# Patient Record
Sex: Male | Born: 2000 | Race: White | Hispanic: No | Marital: Single | Smoking: Never smoker
Health system: Southern US, Community
[De-identification: ages and names within clinical notes are randomized; demographics above are authoritative.]

## PROBLEM LIST (undated history)

## (undated) DIAGNOSIS — Q549 Hypospadias, unspecified: Secondary | ICD-10-CM

## (undated) DIAGNOSIS — T7840XA Allergy, unspecified, initial encounter: Secondary | ICD-10-CM

## (undated) DIAGNOSIS — M79673 Pain in unspecified foot: Secondary | ICD-10-CM

## (undated) HISTORY — PX: WISDOM TOOTH EXTRACTION: SHX21

## (undated) HISTORY — DX: Allergy, unspecified, initial encounter: T78.40XA

## (undated) HISTORY — DX: Hypospadias, unspecified: Q54.9

## (undated) HISTORY — DX: Pain in unspecified foot: M79.673

## (undated) HISTORY — PX: HYPOSPADIAS CORRECTION: SHX483

---
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
## 2019-09-29 NOTE — Progress Notes (Deleted)
Phone: [phone]    Subjective:  Patient presents today for their annual physical. Chief complaint-noted.   See problem oriented charting- ROS- full  review of systems was completed and negative  except for: ***  The following were reviewed and entered/updated in epic: Past Medical History:  Diagnosis Date  . Allergy   . Foot arch pain    wears inserts. [facility]  . Hypospadias    surgery as child   Patient Active Problem List   Diagnosis Date Noted  . Allergic rhinitis 03/09/2016  . Hypospadias    Past Surgical History:  Procedure Laterality Date  . HYPOSPADIAS CORRECTION     2007. had some glandular adhesions also treated after surgery.   . WISDOM TOOTH EXTRACTION      Family History  Problem Relation Age of Onset  . Depression Mother        divorced and remarried. homemaker  . Hyperlipidemia Father        under dads insurance  . Bradycardia Father   . Healthy Sister        5 years younger  . Hypertension Maternal Grandmother   . Depression Maternal Grandmother   . Arthritis Maternal Grandmother   . Hypertension Maternal Grandfather   . Arthritis Maternal Grandfather        prediabetic as well    Medications- reviewed and updated Current Outpatient Medications  Medication Sig Dispense Refill  . ampicillin (PRINCIPEN) 500 MG capsule Take 500 mg by mouth 2 (two) times daily.     . Multiple Vitamin (MULTIVITAMIN) tablet Take 1 tablet by mouth daily.    Marland Kitchen tretinoin (RETIN-A) 0.025 % cream Apply 1 application topically at bedtime.      No current facility-administered medications for this visit.    Allergies-reviewed and updated No Known Allergies  Social History   Social History Narrative   Single. Splits time between mom (remarried) and dads house. 1 sister. Sister at [employer] freshman fall 2019. Wants to study bsuiness   [employer] graduate. IB program. [employer]      Enjoys: Soccer and basketball      Dogs:  Maltese and yorkie.        Objective:  There were no vitals taken for this visit. Gen: NAD, resting comfortably HEENT: Mucous membranes are moist. Oropharynx normal Neck: no thyromegaly CV: RRR no murmurs rubs or gallops Lungs: CTAB no crackles, wheeze, rhonchi Abdomen: soft/nontender/nondistended/normal bowel sounds. No rebound or guarding.  Ext: no edema Skin: warm, dry Neuro: grossly normal, moves all extremities, PERRLA ***    Assessment and Plan:  19 y.o. male presenting for annual physical.  Health Maintenance counseling: 1. Anticipatory guidance: Patient counseled regarding regular dental exams ***q6 months, eye exams ***,  avoiding smoking and second hand smoke*** , limiting alcohol to 2 beverages per day***.   2. Risk factor reduction:  Advised patient of need for regular exercise and diet rich and fruits and vegetables to reduce risk of heart attack and stroke. Exercise- ***. Diet-***.  Wt Readings from Last 3 Encounters:  08/15/17 157 lb (71.2 kg) (60 %, Z= 0.26)*  08/24/16 150 lb 2.1 oz (68.1 kg) (57 %, Z= 0.19)*  03/09/16 151 lb 9.6 oz (68.8 kg) (64 %, Z= 0.36)*   * Growth percentiles are based on CDC (Boys, 2-20 Years) data.   3. Immunizations/screenings/ancillary studies Immunization History  Administered Date(s) Administered  . Influenza,inj,Quad PF,6+ Mos 03/09/2016  . Meningococcal B, OMV 08/15/2017, 11/28/2017  .  Meningococcal Mcv4o 03/09/2016  . Tdap 03/09/2016   Health Maintenance Due  Topic Date Due  . Hepatitis C Screening  Never done  . HIV Screening  Never done  . INFLUENZA VACCINE  08/03/2019    Family History  Problem Relation Age of Onset  . Depression Mother        divorced and remarried. homemaker  . Hyperlipidemia Father        under dads insurance  . Bradycardia Father   . Healthy Sister        5 years younger  . Hypertension Maternal Grandmother   . Depression Maternal Grandmother   . Arthritis Maternal Grandmother   . Hypertension Maternal  Grandfather   . Arthritis Maternal Grandfather        prediabetic as well   4. Prostate cancer screening- *** No results found for: PSA 5. Colon cancer screening - *** 6. Skin cancer screening/prevention- ***advised regular sunscreen use. Denies worrisome, changing, or new skin lesions.  7. Testicular cancer screening- advised monthly self exams *** 8. STD screening- patient opts *** 9. *** smoker-   Status of chronic or acute concerns   No specialty comments available. *** No diagnosis found.  Recommended follow up: ***No follow-ups on file. Future Appointments  Date Time Provider Department Center  09/30/2019 11:20 AM Shelva Majestic, MD [facility]    No chief complaint on file.  Lab/Order associations:*** fasting No diagnosis found.  No orders of the defined types were placed in this encounter.   Return precautions advised.   Lieutenant Diego, CMA

## 2019-09-30 ENCOUNTER — Encounter: Payer: Self-pay | Admitting: Family Medicine

## 2020-08-24 ENCOUNTER — Encounter: Payer: Self-pay | Admitting: Family Medicine

## 2021-02-11 NOTE — Progress Notes (Incomplete)
Phone: [phone]    Subjective:  Patient presents today for their annual physical. Chief complaint-noted.   See problem oriented charting- ROS- full  review of systems was completed and negative  except for: ***  The following were reviewed and entered/updated in epic: Past Medical History:  Diagnosis Date   Allergy    Foot arch pain    wears inserts. [facility]   Hypospadias    surgery as child   Patient Active Problem List   Diagnosis Date Noted   Allergic rhinitis 03/09/2016   Hypospadias    Past Surgical History:  Procedure Laterality Date   HYPOSPADIAS CORRECTION     2007. had some glandular adhesions also treated after surgery.    WISDOM TOOTH EXTRACTION      Family History  Problem Relation Age of Onset   Depression Mother        divorced and remarried. homemaker   Hyperlipidemia Father        under dads insurance   Bradycardia Father    Healthy Sister        5 years younger   Hypertension Maternal Grandmother    Depression Maternal Grandmother    Arthritis Maternal Grandmother    Hypertension Maternal Grandfather    Arthritis Maternal Grandfather        prediabetic as well    Medications- reviewed and updated Current Outpatient Medications  Medication Sig Dispense Refill   ampicillin (PRINCIPEN) 500 MG capsule Take 500 mg by mouth 2 (two) times daily.      Multiple Vitamin (MULTIVITAMIN) tablet Take 1 tablet by mouth daily.     tretinoin (RETIN-A) 0.025 % cream Apply 1 application topically at bedtime.      No current facility-administered medications for this visit.    Allergies-reviewed and updated No Known Allergies  Social History   Social History Narrative   Single. Splits time between mom (remarried) and dads house. 1 sister. Sister at [employer] freshman fall 2019. Wants to study bsuiness   [employer] graduate. IB program. [employer]      Enjoys: Soccer and basketball      Dogs:  Maltese and yorkie.       Objective:   There were no vitals taken for this visit. Gen: NAD, resting comfortably HEENT: Mucous membranes are moist. Oropharynx normal Neck: no thyromegaly CV: RRR no murmurs rubs or gallops Lungs: CTAB no crackles, wheeze, rhonchi Abdomen: soft/nontender/nondistended/normal bowel sounds. No rebound or guarding.  Ext: no edema Skin: warm, dry Neuro: grossly normal, moves all extremities, PERRLA ***    Assessment and Plan:  21 y.o. male presenting for annual physical.  Health Maintenance counseling: 1. Anticipatory guidance: Patient counseled regarding regular dental exams ***q6 months, eye exams--no issues, wearing seatbelts.  ***,  avoiding smoking and second hand smoke*** , limiting alcohol to 2 beverages per day***, no illicit drugs***.  No illicit drugs - *** 2. Risk factor reduction:  Advised patient of need for regular exercise and diet rich and fruits and vegetables to reduce risk of heart attack and stroke.  Exercise-3-4 days a week ***.  Diet/weight management-reasonably balanced. ***  Wt Readings from Last 3 Encounters:  08/15/17 157 lb (71.2 kg) (60 %, Z= 0.26)*  08/24/16 150 lb 2.1 oz (68.1 kg) (57 %, Z= 0.19)*  03/09/16 151 lb 9.6 oz (68.8 kg) (64 %, Z= 0.36)*   * Growth percentiles are based on CDC (Boys, 2-20 Years) data.   3. Immunizations/screenings/ancillary studies  DISCUSSED:  -Flu vaccination (last one 03/2016) - *** -HPV vaccination #1 - *** -HIV Screening- *** -Hepatitis C Screening - *** Immunization History  Administered Date(s) Administered   Influenza,inj,Quad PF,6+ Mos 03/09/2016   Meningococcal B, OMV 08/15/2017, 11/28/2017   Meningococcal Mcv4o 03/09/2016   Tdap 03/09/2016   Health Maintenance Due  Topic Date Due   HPV VACCINES (1 - Male 2-dose series) Never done   HIV Screening  Never done   Hepatitis C Screening  Never done   INFLUENZA VACCINE  08/02/2020    Family History  Problem Relation Age of Onset   Depression Mother        divorced and  remarried. homemaker   Hyperlipidemia Father        under dads insurance   Bradycardia Father    Healthy Sister        5 years younger   Hypertension Maternal Grandmother    Depression Maternal Grandmother    Arthritis Maternal Grandmother    Hypertension Maternal Grandfather    Arthritis Maternal Grandfather        prediabetic as well   4. Prostate cancer screening- no family history, start at age 74  *** No results found for: PSA 5. Colon cancer screening -  no family history, start at age 66 *** 63. Skin cancer screening/prevention- no dermatologist.***advised regular sunscreen use. Denies worrisome, changing, or new skin lesions.  7. Testicular cancer screening- advised monthly self exams *** 8. STD screening-patient opts out- always uses condom *** 9. Smoking associated screening- *** smoker- ***  Status of chronic or acute concerns   @SPECCOMM @ ***  Recommended follow up: No follow-ups on file. Future Appointments  Date Time Provider Department Center  02/14/2021  1:40 PM 02/16/2021, MD [facility]    No chief complaint on file.  Lab/Order associations:*** fasting No diagnosis found.  No orders of the defined types were placed in this encounter.  I,Jada Bradford,acting as a scribe for Shelva Majestic, MD.,have documented all relevant documentation on the behalf of Tana Conch, MD,as directed by  Tana Conch, MD while in the presence of Tana Conch, MD.  ***  Return precautions advised.   Tana Conch

## 2021-02-14 ENCOUNTER — Encounter: Payer: Self-pay | Admitting: Family Medicine
